# Patient Record
Sex: Female | Born: 2000 | Race: Black or African American | Hispanic: No | Marital: Single | Smoking: Never smoker
Health system: Southern US, Community
[De-identification: ages and names within clinical notes are randomized; demographics above are authoritative.]

## PROBLEM LIST (undated history)

## (undated) ENCOUNTER — Ambulatory Visit: Payer: Medicaid Other

## (undated) DIAGNOSIS — L309 Dermatitis, unspecified: Secondary | ICD-10-CM

---
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
## 2022-12-20 ENCOUNTER — Ambulatory Visit
Admission: EM | Admit: 2022-12-20 | Discharge: 2022-12-20 | Disposition: A | Discharge status: Home / Self Care | Payer: Medicaid Other | Attending: Urgent Care | Admitting: Urgent Care

## 2022-12-20 ENCOUNTER — Telehealth: Payer: Self-pay

## 2022-12-20 DIAGNOSIS — B349 Viral infection, unspecified: Secondary | ICD-10-CM | POA: Insufficient documentation

## 2022-12-20 DIAGNOSIS — R070 Pain in throat: Secondary | ICD-10-CM | POA: Insufficient documentation

## 2022-12-20 LAB — POCT RAPID STREP A (OFFICE): Rapid Strep A Screen: NEGATIVE

## 2022-12-20 MED ORDER — PROMETHAZINE-DM 6.25-15 MG/5ML PO SYRP: 2.5000 mL | ORAL_SOLUTION | Freq: Three times a day (TID) | ORAL | 0 refills | Status: DC | PRN

## 2022-12-20 MED ORDER — PSEUDOEPHEDRINE HCL 60 MG PO TABS: 60.0000 mg | ORAL_TABLET | Freq: Three times a day (TID) | ORAL | 0 refills | Status: DC | PRN

## 2022-12-20 MED ORDER — PHENAZOPYRIDINE HCL 200 MG PO TABS: 200.0000 mg | ORAL_TABLET | Freq: Three times a day (TID) | ORAL | 0 refills | Status: DC | PRN

## 2022-12-20 MED ORDER — CETIRIZINE HCL 10 MG PO TABS: 10.0000 mg | ORAL_TABLET | Freq: Every day | ORAL | 0 refills | Status: DC

## 2022-12-20 NOTE — ED Triage Notes (Signed)
Pt states sore throat and cough for the past 5 days.

## 2022-12-20 NOTE — Discharge Instructions (Addendum)
We will manage this as a viral illness. For sore throat or cough try using a honey-based tea. Use 3 teaspoons of honey with juice squeezed from half lemon. Place shaved pieces of ginger into 1/2-1 cup of water and warm over stove top. Then mix the ingredients and repeat every 4 hours as needed. Please take ibuprofen 600mg  every 6 hours with food alternating with OR taken together with Tylenol 500mg -650mg  every 6 hours for throat pain, fevers, aches and pains. Hydrate very well with at least 2 liters of water. Eat light meals such as soups (chicken and noodles, vegetable, chicken and wild rice).  Do not eat foods that you are allergic to.  Taking an antihistamine like cetirizine can help against postnasal drainage, sinus congestion which can cause sinus pain, sinus headaches, throat pain, painful swallowing, coughing.  You can take this together with pseudoephedrine (Sudafed) at a dose of 60 mg 3 times a day or twice daily as needed for the same kind of nasal drip, congestion.  Use together with the cough syrup as needed.

## 2022-12-20 NOTE — ED Provider Notes (Signed)
[facility] - U[facility]  Note:  This document was prepared using Systems analyst and may include unintentional dictation errors.  MRN: [id number] DOB: Oct 31, 2001  Subjective:   Donna Dickerson is a 22 y.o. female presenting for 5 day history of acute onset persistent throat pain, painful swallowing, coughing, subjective fever. Coughing can elicit chest pain, shob. No wheezing. No smoking, marijuana, vaping.   No current facility-administered medications for this encounter.  Current Outpatient Medications:    phenazopyridine (PYRIDIUM) 200 MG tablet, Take 1 tablet (200 mg total) by mouth 3 (three) times daily as needed (for urinary pain)., Disp: 6 tablet, Rfl: 0   No Known Allergies  Past Medical History:  Diagnosis Date   Eczema      History reviewed. No pertinent surgical history.  History reviewed. No pertinent family history.  Social History   Tobacco Use   Smoking status: Never   Smokeless tobacco: Never  Substance Use Topics   Alcohol use: No    ROS   Objective:   Vitals: BP 127/73 (BP Location: Right Arm)   Pulse 81   Temp 98.5 F (36.9 C) (Oral)   Resp 16   LMP 12/11/2022 (Approximate)   SpO2 100%   Physical Exam Constitutional:      General: She is not in acute distress.    Appearance: Normal appearance. She is well-developed and normal weight. She is not ill-appearing, toxic-appearing or diaphoretic.  HENT:     Head: Normocephalic and atraumatic.     Right Ear: Tympanic membrane, ear canal and external ear normal. No drainage or tenderness. No middle ear effusion. There is no impacted cerumen. Tympanic membrane is not erythematous or bulging.     Left Ear: Tympanic membrane, ear canal and external ear normal. No drainage or tenderness.  No middle ear effusion. There is no impacted cerumen. Tympanic membrane is not erythematous or bulging.     Nose: Congestion present. No rhinorrhea.     Mouth/Throat:     Mouth:  Mucous membranes are moist. No oral lesions.     Pharynx: No pharyngeal swelling, oropharyngeal exudate, posterior oropharyngeal erythema or uvula swelling.     Tonsils: No tonsillar exudate or tonsillar abscesses.     Comments: Thick streaks of sinus drainage overlying pharynx.  Eyes:     General: No scleral icterus.       Right eye: No discharge.        Left eye: No discharge.     Extraocular Movements: Extraocular movements intact.     Right eye: Normal extraocular motion.     Left eye: Normal extraocular motion.     Conjunctiva/sclera: Conjunctivae normal.  Cardiovascular:     Rate and Rhythm: Normal rate and regular rhythm.     Heart sounds: Normal heart sounds. No murmur heard.    No friction rub. No gallop.  Pulmonary:     Effort: Pulmonary effort is normal. No respiratory distress.     Breath sounds: No stridor. No wheezing, rhonchi or rales.  Chest:     Chest wall: No tenderness.  Musculoskeletal:     Cervical back: Normal range of motion and neck supple.  Lymphadenopathy:     Cervical: No cervical adenopathy.  Skin:    General: Skin is warm and dry.  Neurological:     General: No focal deficit present.     Mental Status: She is alert and oriented to person, place, and time.  Psychiatric:  Mood and Affect: Mood normal.        Behavior: Behavior normal.     Results for orders placed or performed during the hospital encounter of 12/20/22 (from the past 24 hour(s))  POCT rapid strep A     Status: None   Collection Time: 12/20/22 12:07 PM  Result Value Ref Range   Rapid Strep A Screen Negative Negative    Assessment and Plan :   PDMP not reviewed this encounter.  1. Acute viral syndrome   2. Throat pain     Strep culture pending. Deferred imaging given clear cardiopulmonary exam, hemodynamically stable vital signs. Deferred COVID testing given timeline of illness. Suspect viral URI, viral syndrome. Physical exam findings reassuring and vital signs stable  for discharge. Advised supportive care, offered symptomatic relief. Counseled patient on potential for adverse effects with medications prescribed/recommended today, ER and return-to-clinic precautions discussed, patient verbalized understanding.     Jaynee Eagles, PA-C 12/20/22 1250

## 2022-12-23 LAB — CULTURE, GROUP A STREP (THRC)

## 2023-01-17 ENCOUNTER — Ambulatory Visit: Payer: Self-pay

## 2023-01-20 DIAGNOSIS — Z113 Encounter for screening for infections with a predominantly sexual mode of transmission: Secondary | ICD-10-CM | POA: Diagnosis not present

## 2023-02-01 ENCOUNTER — Ambulatory Visit
Admission: EM | Admit: 2023-02-01 | Discharge: 2023-02-01 | Disposition: A | Discharge status: Home / Self Care | Payer: Medicaid Other | Attending: Urgent Care | Admitting: Urgent Care

## 2023-02-01 DIAGNOSIS — U071 COVID-19: Secondary | ICD-10-CM | POA: Insufficient documentation

## 2023-02-01 DIAGNOSIS — B349 Viral infection, unspecified: Secondary | ICD-10-CM | POA: Diagnosis not present

## 2023-02-01 DIAGNOSIS — R509 Fever, unspecified: Secondary | ICD-10-CM | POA: Diagnosis present

## 2023-02-01 LAB — POCT INFLUENZA A/B
Influenza A, POC: NEGATIVE
Influenza B, POC: NEGATIVE

## 2023-02-01 MED ORDER — PROMETHAZINE-DM 6.25-15 MG/5ML PO SYRP: 5.0000 mL | ORAL_SOLUTION | Freq: Three times a day (TID) | ORAL | 0 refills | Status: DC | PRN

## 2023-02-01 MED ORDER — CETIRIZINE HCL 10 MG PO TABS: 10.0000 mg | ORAL_TABLET | Freq: Every day | ORAL | 0 refills | Status: DC

## 2023-02-01 MED ORDER — PREDNISONE 20 MG PO TABS: ORAL_TABLET | ORAL | 0 refills | Status: DC

## 2023-02-01 NOTE — ED Provider Notes (Signed)
[facility]  Note:  This document was prepared using Systems analyst and may include unintentional dictation errors.  MRN: [id number] DOB: 06/11/2001  Subjective:   Donna Dickerson is a 22 y.o. female presenting for 1 day history of acute onset persistent fever, body pains, coughing, chest congestion, chest pain, malaise and fatigue.  No smoking, vaping, marijuana use.  No history of asthma.  No current facility-administered medications for this encounter.  Current Outpatient Medications:    cetirizine (ZYRTEC ALLERGY) 10 MG tablet, Take 1 tablet (10 mg total) by mouth daily., Disp: 30 tablet, Rfl: 0   phenazopyridine (PYRIDIUM) 200 MG tablet, Take 1 tablet (200 mg total) by mouth 3 (three) times daily as needed (for urinary pain)., Disp: 6 tablet, Rfl: 0   promethazine-dextromethorphan (PROMETHAZINE-DM) 6.25-15 MG/5ML syrup, Take 2.5 mLs by mouth 3 (three) times daily as needed for cough., Disp: 100 mL, Rfl: 0   pseudoephedrine (SUDAFED) 60 MG tablet, Take 1 tablet (60 mg total) by mouth every 8 (eight) hours as needed for congestion., Disp: 30 tablet, Rfl: 0   No Known Allergies  Past Medical History:  Diagnosis Date   Eczema      History reviewed. No pertinent surgical history.  No family history on file.  Social History   Tobacco Use   Smoking status: Never   Smokeless tobacco: Never  Vaping Use   Vaping Use: Never used  Substance Use Topics   Alcohol use: No   Drug use: Never    ROS   Objective:   Vitals: BP 114/83 (BP Location: Right Arm)   Pulse 91   Temp 100.1 F (37.8 C) (Oral)   Resp 18   SpO2 98%   Physical Exam Constitutional:      General: She is not in acute distress.    Appearance: Normal appearance. She is well-developed and normal weight. She is not ill-appearing, toxic-appearing or diaphoretic.  HENT:     Head: Normocephalic and atraumatic.     Right Ear: Tympanic membrane, ear canal and  external ear normal. No drainage or tenderness. No middle ear effusion. There is no impacted cerumen. Tympanic membrane is not erythematous or bulging.     Left Ear: Tympanic membrane, ear canal and external ear normal. No drainage or tenderness.  No middle ear effusion. There is no impacted cerumen. Tympanic membrane is not erythematous or bulging.     Nose: Nose normal. No congestion or rhinorrhea.     Mouth/Throat:     Mouth: Mucous membranes are moist. No oral lesions.     Pharynx: No pharyngeal swelling, oropharyngeal exudate, posterior oropharyngeal erythema or uvula swelling.     Tonsils: No tonsillar exudate or tonsillar abscesses.  Eyes:     General: No scleral icterus.       Right eye: No discharge.        Left eye: No discharge.     Extraocular Movements: Extraocular movements intact.     Right eye: Normal extraocular motion.     Left eye: Normal extraocular motion.     Conjunctiva/sclera: Conjunctivae normal.  Cardiovascular:     Rate and Rhythm: Normal rate and regular rhythm.     Heart sounds: Normal heart sounds. No murmur heard.    No friction rub. No gallop.  Pulmonary:     Effort: Pulmonary effort is normal. No respiratory distress.     Breath sounds: No stridor. No wheezing, rhonchi or rales.  Chest:  Chest wall: No tenderness.  Musculoskeletal:     Cervical back: Normal range of motion and neck supple.  Lymphadenopathy:     Cervical: No cervical adenopathy.  Skin:    General: Skin is warm and dry.  Neurological:     General: No focal deficit present.     Mental Status: She is alert and oriented to person, place, and time.  Psychiatric:        Mood and Affect: Mood normal.        Behavior: Behavior normal.    Point-of-care influenza test was negative for flu a and B.  Assessment and Plan :   PDMP not reviewed this encounter.  1. Acute viral syndrome     Given severity of symptoms, oral prednisone course.  Flu testing negative. Deferred imaging  given clear cardiopulmonary exam, hemodynamically stable vital signs. Will manage for viral illness such as viral URI, viral syndrome, viral rhinitis, COVID-19. Recommended supportive care. Offered scripts for symptomatic relief. Testing is pending. Counseled patient on potential for adverse effects with medications prescribed/recommended today, ER and return-to-clinic precautions discussed, patient verbalized understanding.     Jaynee Eagles, [place] 02/04/23 [phone]

## 2023-02-01 NOTE — Discharge Instructions (Addendum)
We will notify you of your test results as they arrive and may take between about 24 hours.  I encourage you to sign up for MyChart if you have not already done so as this can be the easiest way for [place] to communicate results to you online or through a phone app.  Generally, we only contact you if it is a positive test result.  In the meantime, if you develop worsening symptoms including fever, chest pain, shortness of breath despite our current treatment plan then please report to the emergency room as this may be a sign of worsening status from possible viral infection.  Otherwise, we will manage this as a viral syndrome. For sore throat or cough try using a honey-based tea. Use 3 teaspoons of honey with juice squeezed from half lemon. Place shaved pieces of ginger into 1/2-1 cup of water and warm over stove top. Then mix the ingredients and repeat every 4 hours as needed. Please take Tylenol 574m-650mg every 6 hours for aches and pains, fevers. Hydrate very well with at least 2 liters of water. Eat light meals such as soups to replenish electrolytes and soft fruits, veggies. Start an antihistamine like Zyrtec (161mdaily) for postnasal drainage, sinus congestion.  You can take this together with prednisone.  Use the cough medications as needed.

## 2023-02-01 NOTE — ED Triage Notes (Signed)
Pt cough, fever, body aches, URI sx started yesterday-NAD-steady gait

## 2023-02-02 LAB — SARS CORONAVIRUS 2 (TAT 6-24 HRS): SARS Coronavirus 2: POSITIVE — AB

## 2023-02-21 DIAGNOSIS — T8332XA Displacement of intrauterine contraceptive device, initial encounter: Secondary | ICD-10-CM | POA: Diagnosis not present

## 2023-02-21 DIAGNOSIS — Z30431 Encounter for routine checking of intrauterine contraceptive device: Secondary | ICD-10-CM | POA: Diagnosis not present

## 2023-04-12 DIAGNOSIS — R300 Dysuria: Secondary | ICD-10-CM | POA: Diagnosis not present

## 2023-04-12 DIAGNOSIS — R8290 Unspecified abnormal findings in urine: Secondary | ICD-10-CM | POA: Diagnosis not present

## 2023-06-06 ENCOUNTER — Ambulatory Visit (INDEPENDENT_AMBULATORY_CARE_PROVIDER_SITE_OTHER): Payer: Medicaid Other | Admitting: Family Medicine

## 2023-06-06 ENCOUNTER — Encounter: Payer: Self-pay | Admitting: Family Medicine

## 2023-06-06 VITALS — BP 107/68 | HR 71 | Temp 98.5°F | Resp 18 | Ht 63.0 in | Wt 122.0 lb

## 2023-06-06 DIAGNOSIS — Z111 Encounter for screening for respiratory tuberculosis: Secondary | ICD-10-CM

## 2023-06-06 DIAGNOSIS — Z23 Encounter for immunization: Secondary | ICD-10-CM | POA: Diagnosis not present

## 2023-06-06 DIAGNOSIS — Z7689 Persons encountering health services in other specified circumstances: Secondary | ICD-10-CM | POA: Diagnosis not present

## 2023-06-06 DIAGNOSIS — Z0000 Encounter for general adult medical examination without abnormal findings: Secondary | ICD-10-CM | POA: Diagnosis not present

## 2023-06-06 DIAGNOSIS — Z1159 Encounter for screening for other viral diseases: Secondary | ICD-10-CM | POA: Diagnosis not present

## 2023-06-06 DIAGNOSIS — Z0184 Encounter for antibody response examination: Secondary | ICD-10-CM

## 2023-06-06 DIAGNOSIS — R7302 Impaired glucose tolerance (oral): Secondary | ICD-10-CM

## 2023-06-06 NOTE — Progress Notes (Signed)
Complete physical exam and establish care  Patient: Donna Dickerson   DOB: 2001/09/19   21 y.o. Female  MRN: [id number]  Subjective:    Chief Complaint  Patient presents with   Annual Exam    Patient is here for a physical for employment and she will also need TB skin test    Donna Dickerson is a 22 y.o. female who presents today for a complete physical exam. She reports consuming a general diet. The patient does not participate in regular exercise at present. She generally feels well. She reports sleeping well. She does not have additional problems to discuss today.  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 She has received 1 varicella vaccine per [facility]. Will draw titers. Needs TB test done. Will do blood test. She would like to start the HPV vaccine series.  Up to date with pap smear.  No medical conditions. No medicines other than the IUD.   No allergies.  Most recent fall risk assessment:    06/06/2023    8:59 AM  Fall Risk   Falls in the past year? 0  Number falls in past yr: 0  Injury with Fall? 0  Risk for fall due to : No Fall Risks  Follow up Falls evaluation completed     Most recent depression screenings:    06/06/2023    8:59 AM  PHQ 2/9 Scores  PHQ - 2 Score 0  PHQ- 9 Score 0    Vision:Not within last year   Patient Active Problem List   Diagnosis Date Noted   CONTUSION, TOE 06/11/2010   URI 05/26/2010   Past Medical History:  Diagnosis Date   Eczema    History reviewed. No pertinent surgical history. Social History   Tobacco Use   Smoking status: Never    Passive exposure: Never   Smokeless tobacco: Never  Vaping Use   Vaping Use: Never used  Substance Use Topics   Alcohol use: Yes    Comment: occassionally   Drug use: Never   Family Status  Relation Name Status   Mother  Alive   Father  Alive   No Known Allergies    Patient Care Team: Suzan Slick, MD as PCP - General (Family Medicine)   Outpatient Medications  Prior to Visit  Medication Sig   cetirizine (ZYRTEC ALLERGY) 10 MG tablet Take 1 tablet (10 mg total) by mouth daily.   levonorgestrel (KYLEENA) 19.5 MG IUD 1 each by Intrauterine route once.   [DISCONTINUED] phenazopyridine (PYRIDIUM) 200 MG tablet Take 1 tablet (200 mg total) by mouth 3 (three) times daily as needed (for urinary pain).   [DISCONTINUED] predniSONE (DELTASONE) 20 MG tablet Take 2 tablets daily with breakfast.   [DISCONTINUED] promethazine-dextromethorphan (PROMETHAZINE-DM) 6.25-15 MG/5ML syrup Take 5 mLs by mouth 3 (three) times daily as needed for cough.   No facility-administered medications prior to visit.    Review of Systems  All other systems reviewed and are negative.         Objective:     BP 107/68   Pulse 71   Temp 98.5 F (36.9 C) (Oral)   Resp 18   Ht 5\' 3"  (1.6 m)   Wt 122 lb (55.3 kg)   LMP 04/20/2023 (Exact Date)   SpO2 100%   BMI 21.61 kg/m  BP Readings from Last 3 Encounters:  06/06/23 107/68  02/01/23 114/83  12/20/22 127/73      Physical Exam Vitals and nursing note reviewed.  Constitutional:      Appearance: Normal appearance. She is normal weight.  HENT:     Head: Normocephalic and atraumatic.     Right Ear: Tympanic membrane, ear canal and external ear normal.     Left Ear: Tympanic membrane, ear canal and external ear normal.     Nose: Nose normal.     Mouth/Throat:     Mouth: Mucous membranes are moist.     Pharynx: Oropharynx is clear.  Eyes:     Conjunctiva/sclera: Conjunctivae normal.     Pupils: Pupils are equal, round, and reactive to light.  Cardiovascular:     Rate and Rhythm: Normal rate and regular rhythm.     Pulses: Normal pulses.     Heart sounds: Normal heart sounds.  Pulmonary:     Effort: Pulmonary effort is normal.     Breath sounds: Normal breath sounds.  Abdominal:     General: Abdomen is flat. Bowel sounds are normal.  Musculoskeletal:        General: Normal range of motion.  Skin:     General: Skin is warm.     Capillary Refill: Capillary refill takes less than 2 seconds.  Neurological:     General: No focal deficit present.     Mental Status: She is alert. Mental status is at baseline.  Psychiatric:        Mood and Affect: Mood normal.        Behavior: Behavior normal.        Thought Content: Thought content normal.        Judgment: Judgment normal.      No results found for any visits on 06/06/23.      Assessment & Plan:    Routine Health Maintenance and Physical Exam  Immunization History  Administered Date(s) Administered   Tdap 09/06/2022    Health Maintenance  Topic Date Due   COVID-19 Vaccine (1) Never done   HPV VACCINES (1 - 2-dose series) Never done   HIV Screening  Never done   Hepatitis C Screening  Never done   INFLUENZA VACCINE  07/21/2023   PAP-Cervical Cytology Screening  11/16/2024   PAP SMEAR-Modifier  11/16/2024   DTaP/Tdap/Td (2 - Td or Tdap) 09/06/2032    Discussed health benefits of physical activity, and encouraged her to engage in regular exercise appropriate for her age and condition.  Problem List Items Addressed This Visit   None Visit Diagnoses     Annual physical exam    -  Primary   Encounter to establish care with new doctor       Screening-pulmonary TB       Relevant Orders   QuantiFERON-TB Gold Plus   Impaired glucose tolerance       Relevant Orders   CBC with Differential/Platelet   Comprehensive metabolic panel   Hemoglobin A1c   Screening for viral disease       Relevant Orders   Hepatitis C antibody   HIV Antibody (routine testing w rflx)   Need for HPV vaccination       Relevant Orders   HPV 9-valent vaccine,Recombinat   Immunity status testing       Relevant Orders   Varicella Zoster Abs, IgG/IgM     Annual physical exam  Encounter to establish care with new doctor  Screening-pulmonary TB -     QuantiFERON-TB Gold Plus  Impaired glucose tolerance -     CBC with Differential/Platelet -      Comprehensive metabolic  panel -     Hemoglobin A1c  Screening for viral disease -     Hepatitis C antibody -     HIV Antibody (routine testing w rflx)  Need for HPV vaccination -     HPV 9-valent vaccine,Recombinat  Immunity status testing -     Varicella Zoster Abs, IgG/IgM   Screening labs including titers for T and varicella as she only received 1 vaccine per record HPV #1 today return in 4 weeks then 6 months for 2nd and 3rd dose respectively.  Return in about 1 year (around 06/05/2024) for Annual Physical.     Suzan Slick, MD

## 2023-06-08 LAB — VARICELLA ZOSTER ABS, IGG/IGM
Varicella IgM: <0.91 index (ref 0.00–0.90)
Varicella zoster IgG: <135 index — ABNORMAL LOW (ref >165)

## 2023-06-10 ENCOUNTER — Encounter: Payer: Self-pay | Admitting: Family Medicine

## 2023-06-10 LAB — QUANTIFERON-TB GOLD PLUS
QuantiFERON Mitogen Value: >10 IU/mL
QuantiFERON Nil Value: 0.01 IU/mL
QuantiFERON TB1 Ag Value: 0.01 IU/mL
QuantiFERON TB2 Ag Value: 0.01 IU/mL
QuantiFERON-TB Gold Plus: NEGATIVE

## 2023-06-10 LAB — COMPREHENSIVE METABOLIC PANEL
ALT: 9 IU/L (ref 0–32)
AST: 17 IU/L (ref 0–40)
Albumin: 4.7 g/dL (ref 4.0–5.0)
Alkaline Phosphatase: 60 IU/L (ref 44–121)
BUN/Creatinine Ratio: 11 (ref 9–23)
BUN: 9 mg/dL (ref 6–20)
Bilirubin Total: 0.6 mg/dL (ref 0.0–1.2)
CO2: 20 mmol/L (ref 20–29)
Calcium: 9.3 mg/dL (ref 8.7–10.2)
Chloride: 104 mmol/L (ref 96–106)
Creatinine, Ser: 0.82 mg/dL (ref 0.57–1.00)
Globulin, Total: 2.5 g/dL (ref 1.5–4.5)
Glucose: 83 mg/dL (ref 70–99)
Potassium: 4.1 mmol/L (ref 3.5–5.2)
Sodium: 139 mmol/L (ref 134–144)
Total Protein: 7.2 g/dL (ref 6.0–8.5)
eGFR: 104 mL/min/{1.73_m2} (ref >59)

## 2023-06-10 LAB — HEMOGLOBIN A1C
Est. average glucose Bld gHb Est-mCnc: 100 mg/dL
Hgb A1c MFr Bld: 5.1 % (ref 4.8–5.6)

## 2023-06-10 LAB — CBC WITH DIFFERENTIAL/PLATELET
Basophils Absolute: 0 10*3/uL (ref 0.0–0.2)
Basos: 1 %
EOS (ABSOLUTE): 0.1 10*3/uL (ref 0.0–0.4)
Eos: 2 %
Hematocrit: 35.2 % (ref 34.0–46.6)
Hemoglobin: 11.4 g/dL (ref 11.1–15.9)
Immature Grans (Abs): 0 10*3/uL (ref 0.0–0.1)
Immature Granulocytes: 0 %
Lymphocytes Absolute: 1.4 10*3/uL (ref 0.7–3.1)
Lymphs: 30 %
MCH: 22.8 pg — ABNORMAL LOW (ref 26.6–33.0)
MCHC: 32.4 g/dL (ref 31.5–35.7)
MCV: 71 fL — ABNORMAL LOW (ref 79–97)
Monocytes Absolute: 0.5 10*3/uL (ref 0.1–0.9)
Monocytes: 11 %
Neutrophils Absolute: 2.6 10*3/uL (ref 1.4–7.0)
Neutrophils: 56 %
Platelets: 277 10*3/uL (ref 150–450)
RBC: 4.99 x10E6/uL (ref 3.77–5.28)
RDW: 18.8 % — ABNORMAL HIGH (ref 11.7–15.4)
WBC: 4.5 10*3/uL (ref 3.4–10.8)

## 2023-06-10 LAB — HIV ANTIBODY (ROUTINE TESTING W REFLEX): HIV Screen 4th Generation wRfx: NONREACTIVE

## 2023-06-10 LAB — HEPATITIS C ANTIBODY: Hep C Virus Ab: NONREACTIVE

## 2023-07-04 ENCOUNTER — Ambulatory Visit (INDEPENDENT_AMBULATORY_CARE_PROVIDER_SITE_OTHER): Payer: Medicaid Other | Admitting: Family Medicine

## 2023-07-04 VITALS — BP 94/60 | HR 76 | Temp 98.5°F | Resp 18 | Ht 63.0 in | Wt 123.7 lb

## 2023-07-04 DIAGNOSIS — Z23 Encounter for immunization: Secondary | ICD-10-CM

## 2023-07-04 NOTE — Progress Notes (Signed)
   Subjective:    Patient ID: Donna Dickerson, female    DOB: 11/12/2001, 22 y.o.   MRN: [id number]  HPI Patient is here for 2nd dose of HPV vaccine    Review of Systems     Objective:   Physical Exam        Assessment & Plan:   Patient tolerated vaccine well without any complications

## 2023-08-08 ENCOUNTER — Ambulatory Visit
Admission: EM | Admit: 2023-08-08 | Discharge: 2023-08-08 | Disposition: A | Discharge status: Home / Self Care | Payer: Medicaid Other | Attending: Internal Medicine | Admitting: Internal Medicine

## 2023-08-08 DIAGNOSIS — Z1152 Encounter for screening for COVID-19: Secondary | ICD-10-CM | POA: Diagnosis not present

## 2023-08-08 DIAGNOSIS — R051 Acute cough: Secondary | ICD-10-CM | POA: Insufficient documentation

## 2023-08-08 DIAGNOSIS — B349 Viral infection, unspecified: Secondary | ICD-10-CM | POA: Diagnosis not present

## 2023-08-08 NOTE — ED Triage Notes (Signed)
Pt presents to UC w/ c/o coughing, sneezing, runny nose, sinus pain x2 days. No reported fevers. Pt tried otc robitussin w/o relief.

## 2023-08-08 NOTE — ED Provider Notes (Signed)
[facility]    CSN: [id number] Arrival date & time: 08/08/23  4098      History   Chief Complaint Chief Complaint  Patient presents with   Nasal Congestion    HPI Donna Dickerson is a 22 y.o. female  presents for evaluation of URI symptoms for 2 days. Patient reports associated symptoms of cough, sore throat, congestion. Denies N/V/D, fevers, ear pain, body aches, shortness of breath. Patient does not have a hx of asthma or smoking. 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  Pt has taken Robitussin OTC for symptoms. Pt has no other concerns at this time.   HPI  Past Medical History:  Diagnosis Date   Eczema     Patient Active Problem List   Diagnosis Date Noted   CONTUSION, TOE 06/11/2010   URI 05/26/2010    History reviewed. No pertinent surgical history.  OB History   No obstetric history on file.      Home Medications    Prior to Admission medications   Medication Sig Start Date End Date Taking? Authorizing Provider  cetirizine (ZYRTEC ALLERGY) 10 MG tablet Take 1 tablet (10 mg total) by mouth daily. 02/01/23   Wallis Bamberg, PA-C  levonorgestrel [facility]) 19.5 MG IUD 1 each by Intrauterine route once. 01/24/23   [provider]    Family History Family History  Problem Relation Age of Onset   Healthy Mother    Healthy Father     Social History Social History   Tobacco Use   Smoking status: Never    Passive exposure: Never   Smokeless tobacco: Never  Vaping Use   Vaping status: Never Used  Substance Use Topics   Alcohol use: Yes    Comment: occassionally   Drug use: Never     Allergies   Patient has no known allergies.   Review of Systems Review of Systems  HENT:  Positive for congestion and sore throat.   Respiratory:  Positive for cough.      Physical Exam Triage Vital Signs ED Triage Vitals  Encounter Vitals Group     BP 08/08/23 1013 119/78     Systolic BP Percentile --      Diastolic BP  Percentile --      Pulse Rate 08/08/23 1013 75     Resp 08/08/23 1013 16     Temp 08/08/23 1013 98.8 F (37.1 C)     Temp Source 08/08/23 1013 Oral     SpO2 08/08/23 1013 99 %     Weight --      Height --      Head Circumference --      Peak Flow --      Pain Score 08/08/23 1018 0     Pain Loc --      Pain Education --      Exclude from Growth Chart --    No data found.  Updated Vital Signs BP 119/78 (BP Location: Right Arm)   Pulse 75   Temp 98.8 F (37.1 C) (Oral)   Resp 16   SpO2 99%   Visual Acuity Right Eye Distance:   Left Eye Distance:   Bilateral Distance:    Right Eye Near:   Left Eye Near:    Bilateral Near:     Physical Exam Vitals and nursing note reviewed.  Constitutional:      General: She is not in acute distress.    Appearance: She is well-developed. She  is not ill-appearing.  HENT:     Head: Normocephalic and atraumatic.     Right Ear: Tympanic membrane and ear canal normal.     Left Ear: Tympanic membrane and ear canal normal.     Nose: Congestion present.     Mouth/Throat:     Mouth: Mucous membranes are moist.     Pharynx: Oropharynx is clear. Uvula midline. Posterior oropharyngeal erythema present.     Tonsils: No tonsillar exudate or tonsillar abscesses.  Eyes:     Conjunctiva/sclera: Conjunctivae normal.     Pupils: Pupils are equal, round, and reactive to light.  Cardiovascular:     Rate and Rhythm: Normal rate and regular rhythm.     Heart sounds: Normal heart sounds.  Pulmonary:     Effort: Pulmonary effort is normal.     Breath sounds: Normal breath sounds.  Musculoskeletal:     Cervical back: Normal range of motion and neck supple.  Lymphadenopathy:     Cervical: No cervical adenopathy.  Skin:    General: Skin is warm and dry.  Neurological:     General: No focal deficit present.     Mental Status: She is alert and oriented to person, place, and time.  Psychiatric:        Mood and Affect: Mood normal.        Behavior:  Behavior normal.      UC Treatments / Results  Labs (all labs ordered are listed, but only abnormal results are displayed) Labs Reviewed  SARS CORONAVIRUS 2 (TAT 6-24 HRS)    EKG   Radiology No results found.  Procedures Procedures (including critical care time)  Medications Ordered in UC Medications - No data to display  Initial Impression / Assessment and Plan / UC Course  I have reviewed the triage vital signs and the nursing notes.  Pertinent labs & imaging results that were available during my care of the patient were reviewed by me and considered in my medical decision making (see chart for details).     Reviewed exam and symptoms with patient.  No red flags.  She is well-appearing and in no acute distress.  COVID PCR and will contact if positive.  Patient declined strep testing.  Discussed viral illness and symptomatic treatment.  Continue over-the-counter cough medicine/cold medicine as needed.  PCP follow-up if symptoms do not improve.  ER precautions reviewed and patient verbalized understanding. Final Clinical Impressions(s) / UC Diagnoses   Final diagnoses:  Acute cough  Viral illness     Discharge Instructions      The clinic will contact you with results of the COVID test done today if it is positive.  Please treat your symptoms with over the counter cough medication, tylenol or ibuprofen, humidifier, and rest. Viral illnesses can last 7-14 days. Please follow up with your PCP if your symptoms are not improving. Please go to the ER for any worsening symptoms. This includes but is not limited to fever you can not control with tylenol or ibuprofen, you are not able to stay hydrated, you have shortness of breath or chest pain.  Thank you for choosing [facility] for your healthcare needs. I hope you feel better soon!    ED Prescriptions   None    PDMP not reviewed this encounter.   Radford Pax, NP 08/08/23 [phone]

## 2023-08-08 NOTE — Discharge Instructions (Addendum)
The clinic will contact you with results of the COVID test done today if it is positive.  Please treat your symptoms with over the counter cough medication, tylenol or ibuprofen, humidifier, and rest. Viral illnesses can last 7-14 days. Please follow up with your PCP if your symptoms are not improving. Please go to the ER for any worsening symptoms. This includes but is not limited to fever you can not control with tylenol or ibuprofen, you are not able to stay hydrated, you have shortness of breath or chest pain.  Thank you for choosing [facility] for your healthcare needs. I hope you feel better soon!

## 2023-08-09 LAB — SARS CORONAVIRUS 2 (TAT 6-24 HRS): SARS Coronavirus 2: NEGATIVE

## 2023-09-22 DIAGNOSIS — Z113 Encounter for screening for infections with a predominantly sexual mode of transmission: Secondary | ICD-10-CM | POA: Diagnosis not present

## 2023-09-22 DIAGNOSIS — R102 Pelvic and perineal pain: Secondary | ICD-10-CM | POA: Diagnosis not present

## 2023-09-22 DIAGNOSIS — Z975 Presence of (intrauterine) contraceptive device: Secondary | ICD-10-CM | POA: Diagnosis not present

## 2023-09-22 DIAGNOSIS — Z30431 Encounter for routine checking of intrauterine contraceptive device: Secondary | ICD-10-CM | POA: Diagnosis not present

## 2023-12-06 ENCOUNTER — Ambulatory Visit: Payer: PRIVATE HEALTH INSURANCE

## 2024-01-23 ENCOUNTER — Ambulatory Visit: Payer: PRIVATE HEALTH INSURANCE

## 2024-02-09 DIAGNOSIS — Z113 Encounter for screening for infections with a predominantly sexual mode of transmission: Secondary | ICD-10-CM | POA: Diagnosis not present

## 2024-02-09 DIAGNOSIS — Z124 Encounter for screening for malignant neoplasm of cervix: Secondary | ICD-10-CM | POA: Diagnosis not present

## 2024-02-09 DIAGNOSIS — Z975 Presence of (intrauterine) contraceptive device: Secondary | ICD-10-CM | POA: Diagnosis not present

## 2024-02-09 DIAGNOSIS — Z0000 Encounter for general adult medical examination without abnormal findings: Secondary | ICD-10-CM | POA: Diagnosis not present

## 2024-04-27 ENCOUNTER — Ambulatory Visit
Admission: EM | Admit: 2024-04-27 | Discharge: 2024-04-27 | Disposition: A | Discharge status: Home / Self Care | Payer: PRIVATE HEALTH INSURANCE | Attending: Family Medicine | Admitting: Family Medicine

## 2024-04-27 DIAGNOSIS — J309 Allergic rhinitis, unspecified: Secondary | ICD-10-CM | POA: Diagnosis not present

## 2024-04-27 DIAGNOSIS — B9789 Other viral agents as the cause of diseases classified elsewhere: Secondary | ICD-10-CM | POA: Diagnosis not present

## 2024-04-27 DIAGNOSIS — J988 Other specified respiratory disorders: Secondary | ICD-10-CM

## 2024-04-27 LAB — POC COVID19/FLU A&B COMBO
Covid Antigen, POC: NEGATIVE
Influenza A Antigen, POC: NEGATIVE
Influenza B Antigen, POC: NEGATIVE

## 2024-04-27 MED ORDER — PREDNISONE 10 MG PO TABS: 30.0000 mg | ORAL_TABLET | Freq: Every day | ORAL | 0 refills | Status: AC

## 2024-04-27 MED ORDER — CETIRIZINE HCL 10 MG PO TABS: 10.0000 mg | ORAL_TABLET | Freq: Every day | ORAL | 0 refills | Status: AC

## 2024-04-27 MED ORDER — PROMETHAZINE-DM 6.25-15 MG/5ML PO SYRP: 5.0000 mL | ORAL_SOLUTION | Freq: Three times a day (TID) | ORAL | 0 refills | Status: AC | PRN

## 2024-04-27 NOTE — ED Triage Notes (Signed)
 Sx x 3 days  Non productive cough that hurts chest Migraine Sore throat

## 2024-04-27 NOTE — Discharge Instructions (Signed)
 We will manage this as a viral respiratory infection likely made worse by her allergies. For sore throat or cough try using a honey-based tea. Use 3 teaspoons of honey with juice squeezed from half lemon. Place shaved pieces of ginger into 1/2-1 cup of water and warm over stove top. Then mix the ingredients and repeat every 4 hours as needed. Please take Tylenol  500mg -650mg  once every 6 hours for fevers, aches and pains. Hydrate very well with at least 2 liters (64 ounces) of water. Eat light meals such as soups (chicken and noodles, chicken wild rice, vegetable).  Do not eat any foods that you are allergic to.  Start an antihistamine like Zyrtec  (10mg  daily) for postnasal drainage, sinus congestion.  You can take this together with prednisone  for the allergic rhinitis flare.  Can also use cough syrup as needed.

## 2024-04-27 NOTE — ED Provider Notes (Signed)
 [facility]  Note:  This document was prepared using Conservation officer, historic buildings and may include unintentional dictation errors.  MRN: [id number] DOB: June 16, 2001  Subjective:   Donna Dickerson is a 23 y.o. female presenting for 3-day history of acute onset persistent and worsening sinus congestion, sinus headaches, dry hacking cough that elicits chest pain, hoarseness.  No shortness of breath or wheezing, body pains, nausea, vomiting, abdominal pain, rashes.  No history of asthma.  Patient does have allergies but has not taken anything consistently for this.  No smoking of any kind including cigarettes, cigars, vaping, marijuana use.  No smoking of any kind including cigarettes, cigars, vaping, marijuana use.    No current facility-administered medications for this encounter.  Current Outpatient Medications:    levonorgestrel (KYLEENA) 19.5 MG IUD, 1 each by Intrauterine route once., Disp: , Rfl:    cetirizine  (ZYRTEC  ALLERGY) 10 MG tablet, Take 1 tablet (10 mg total) by mouth daily., Disp: 30 tablet, Rfl: 0   No Known Allergies  Past Medical History:  Diagnosis Date   Eczema      History reviewed. No pertinent surgical history.  Family History  Problem Relation Age of Onset   Healthy Mother    Healthy Father     Social History   Tobacco Use   Smoking status: Never    Passive exposure: Never   Smokeless tobacco: Never  Vaping Use   Vaping status: Never Used  Substance Use Topics   Alcohol use: Yes    Comment: occassionally   Drug use: Never    ROS   Objective:   Vitals: BP 119/81 (BP Location: Right Arm)   Pulse 69   Temp 98.6 F (37 C) (Oral)   Resp 16   SpO2 97%   Physical Exam Constitutional:      General: She is not in acute distress.    Appearance: Normal appearance. She is well-developed and normal weight. She is not ill-appearing, toxic-appearing or diaphoretic.  HENT:     Head: Normocephalic and atraumatic.      Right Ear: Tympanic membrane, ear canal and external ear normal. No drainage or tenderness. No middle ear effusion. There is no impacted cerumen. Tympanic membrane is not erythematous or bulging.     Left Ear: Tympanic membrane, ear canal and external ear normal. No drainage or tenderness.  No middle ear effusion. There is no impacted cerumen. Tympanic membrane is not erythematous or bulging.     Nose: Congestion present. No rhinorrhea.     Mouth/Throat:     Mouth: Mucous membranes are moist. No oral lesions.     Pharynx: No pharyngeal swelling, oropharyngeal exudate, posterior oropharyngeal erythema or uvula swelling.     Tonsils: No tonsillar exudate or tonsillar abscesses.     Comments: Hoarseness of voice noted.  Cobblestone pattern postnasal drainage overlying pharynx. Eyes:     General: No scleral icterus.       Right eye: No discharge.        Left eye: No discharge.     Extraocular Movements: Extraocular movements intact.     Right eye: Normal extraocular motion.     Left eye: Normal extraocular motion.     Conjunctiva/sclera: Conjunctivae normal.  Cardiovascular:     Rate and Rhythm: Normal rate and regular rhythm.     Heart sounds: Normal heart sounds. No murmur heard.    No friction rub. No gallop.  Pulmonary:     Effort: Pulmonary effort is  normal. No respiratory distress.     Breath sounds: No stridor. No wheezing, rhonchi or rales.  Chest:     Chest wall: No tenderness.  Musculoskeletal:     Cervical back: Normal range of motion and neck supple.  Lymphadenopathy:     Cervical: No cervical adenopathy.  Skin:    General: Skin is warm and dry.  Neurological:     General: No focal deficit present.     Mental Status: She is alert and oriented to person, place, and time.  Psychiatric:        Mood and Affect: Mood normal.        Behavior: Behavior normal.     Results for orders placed or performed during the hospital encounter of 04/27/24 (from the past 24 hours)  POC  Covid19/Flu A&B Antigen     Status: Normal   Collection Time: 04/27/24 11:49 AM  Result Value Ref Range   Influenza A Antigen, POC Negative    Influenza B Antigen, POC Negative    Covid Antigen, POC Negative     Assessment and Plan :   PDMP not reviewed this encounter.  1. Viral respiratory infection   2. Allergic rhinitis, unspecified seasonality, unspecified trigger    Acute on chronic allergic rhinitis likely making respiratory symptoms worse for her viral URI.  Recommended a oral prednisone  course.  Use supportive care otherwise for the viral URI.  Deferred imaging given clear cardiopulmonary exam, hemodynamically stable vital signs.  Maintain Zyrtec  through the end of the season.  Counseled patient on potential for adverse effects with medications prescribed/recommended today, ER and return-to-clinic precautions discussed, patient verbalized understanding.    Adolph Hoop, [place] 04/27/24 1610

## 2024-06-06 ENCOUNTER — Encounter: Payer: PRIVATE HEALTH INSURANCE | Admitting: Family Medicine

## 2024-06-21 ENCOUNTER — Encounter: Payer: PRIVATE HEALTH INSURANCE | Admitting: Family Medicine

## 2024-07-23 ENCOUNTER — Encounter: Payer: PRIVATE HEALTH INSURANCE | Admitting: Family Medicine

## 2024-09-14 ENCOUNTER — Encounter: Payer: PRIVATE HEALTH INSURANCE | Admitting: Family Medicine

## 2024-12-24 ENCOUNTER — Encounter: Payer: PRIVATE HEALTH INSURANCE | Admitting: Family Medicine

## 2025-02-15 ENCOUNTER — Encounter: Payer: PRIVATE HEALTH INSURANCE | Admitting: Family Medicine

## 2025-03-29 ENCOUNTER — Encounter: Payer: PRIVATE HEALTH INSURANCE | Admitting: Family Medicine
